# Patient Record
Sex: Female | Born: 1990 | Race: White | Hispanic: No | Marital: Married | Smoking: Never smoker
Health system: Southern US, Community
[De-identification: ages and names within clinical notes are randomized; demographics above are authoritative.]

## PROBLEM LIST (undated history)

## (undated) DIAGNOSIS — G500 Trigeminal neuralgia: Secondary | ICD-10-CM

## (undated) HISTORY — DX: Trigeminal neuralgia: G50.0

---

## 2019-12-25 ENCOUNTER — Encounter: Payer: Self-pay | Admitting: Neurology

## 2019-12-26 NOTE — Progress Notes (Signed)
NEUROLOGY CONSULTATION NOTE  Prabhnoor Ellenberger MRN: [id number] DOB: [birth year]  Referring provider: Maryjane Hurter, MD Primary care provider: No PCP  Reason for consult:  Possible trigeminal neuralgia  HISTORY OF PRESENT ILLNESS: Traci James is a 29 year old right-handed female who presents for possible right-sided trigeminal neuralgia.  She is accompanied by her mother who supplements history.  For the past couple of years, she has had intermittent right facial pain.  Usually it is mild and occurs after a dental cleaning.  Last Thursday, it occurred suddenly without warning and much more severe.  She describes severe paroxysmal shooting pain radiating to the right cheek and along the right side of her jaw.  Sometimes she feels a shooting pain up around her right eye.  Pain is worse at night.  No numbness or tingling but sometimes she notes an itching sensation in her mouth.  The pain occurs off and on all day.  In between, she reports an aching sensation.  She went to Urgent Care on Tuesday and was prescribed Tegretol 200mg  twice daily.  The Tegretol takes the edge off but it does not completely relieve the pain.  On two occasions in the past, she was driving and couldn't make her right foot move for several seconds.    No family history of trigeminal neuralgia but her mother has dermatomyositis.     PAST MEDICAL HISTORY: Past Medical History:  Diagnosis Date  . Trigeminal neuralgia     PAST SURGICAL HISTORY: History reviewed. No pertinent surgical history.  MEDICATIONS: Carbamazepine 200mg  twice daily JUNEL FE  ALLERGIES: No Known Allergies   FAMILY HISTORY: Family History  Problem Relation Age of Onset  . Dermatomyositis Mother     SOCIAL HISTORY: Social History   Socioeconomic History  . Marital status: Married    Spouse name: Not on file  . Number of children: Not on file  . Years of education: Not on file  . Highest education level: Not on file    Occupational History  . Not on file  Tobacco Use  . Smoking status: Never Smoker  . Smokeless tobacco: Never Used  Vaping Use  . Vaping Use: Never used  Substance and Sexual Activity  . Alcohol use: Never  . Drug use: Never  . Sexual activity: Yes    Birth control/protection: Pill  Other Topics Concern  . Not on file  Social History Narrative   Right handed   Social Determinants of Health   Financial Resource Strain:   . Difficulty of Paying Living Expenses: Not on file  Food Insecurity:   . Worried About in the Last Year: Not on file  . Ran Out of Food in the Last Year: Not on file  Transportation Needs:   . Lack of Transportation (Medical): Not on file  . Lack of Transportation (Non-Medical): Not on file  Physical Activity:   . Days of Exercise per Week: Not on file  . Minutes of Exercise per Session: Not on file  Stress:   . Feeling of Stress : Not on file  Social Connections:   . Frequency of Communication with Friends and Family: Not on file  . Frequency of Social Gatherings with Friends and Family: Not on file  . Attends Religious Services: Not on file  . Active Member of Clubs or Organizations: Not on file  . Attends Meetings: Not on file  . Marital Status: Not on file  Intimate Partner Violence:   .  Fear of Current or Ex-Partner: Not on file  . Emotionally Abused: Not on file  . Physically Abused: Not on file  . Sexually Abused: Not on file    PHYSICAL EXAM: Blood pressure 128/87, pulse 93, height 5\' 2"  (1.575 m), weight 196 lb (88.9 kg), SpO2 100 %. General: No acute distress.  Patient appears well-groomed.  Head:  Normocephalic/atraumatic Eyes:  fundi examined but not visualized Neck: supple, no paraspinal tenderness, full range of motion Back: No paraspinal tenderness Heart: regular rate and rhythm Lungs: Clear to auscultation bilaterally. Vascular: No carotid bruits. Neurological Exam: Mental status: alert  and oriented to person, place, and time, recent and remote memory intact, fund of knowledge intact, attention and concentration intact, speech fluent and not dysarthric, language intact. Cranial nerves: CN I: not tested CN II: pupils equal, round and reactive to light, visual fields intact CN III, IV, VI:  full range of motion, no nystagmus, no ptosis CN V: facial sensation intact CN VII: upper and lower face symmetric CN VIII: hearing intact CN IX, X: gag intact, uvula midline CN XI: sternocleidomastoid and trapezius muscles intact CN XII: tongue midline Bulk & Tone: normal, no fasciculations. Motor:  5/5 throughout  Sensation:  Pinprick and vibration sensation intact. Deep Tendon Reflexes:  2+ throughout, toes downgoing.  Finger to nose testing:  Without dysmetria.  Heel to shin:  Without dysmetria.  Gait:  Normal station and stride.  Able to turn and tandem walk. Romberg negative.  IMPRESSION: Right sided trigeminal neuralgia  PLAN: 1.  Continue carbamazepine 200mg  twice daily through the weekend.  If pain not sufficiently controlled by Monday, she is to contact me and we can increase dose to 300mg  twice daily. 2.  Will prescribe tramadol 50mg  for breakthrough pain. 3.  Check MRI of brain and right trigeminal nerve with and without contrast. 4.  Check baseline CBC and CMP for medication management 5.  Follow up 4 months.  Thank you for allowing me to take part in the care of this patient.  , DO

## 2019-12-28 ENCOUNTER — Other Ambulatory Visit (INDEPENDENT_AMBULATORY_CARE_PROVIDER_SITE_OTHER): Payer: Managed Care, Other (non HMO)

## 2019-12-28 ENCOUNTER — Encounter: Payer: Self-pay | Admitting: Neurology

## 2019-12-28 ENCOUNTER — Ambulatory Visit: Payer: Managed Care, Other (non HMO) | Admitting: Neurology

## 2019-12-28 ENCOUNTER — Other Ambulatory Visit: Payer: Self-pay

## 2019-12-28 VITALS — BP 128/87 | HR 93 | Ht 62.0 in | Wt 196.0 lb

## 2019-12-28 DIAGNOSIS — G500 Trigeminal neuralgia: Secondary | ICD-10-CM | POA: Diagnosis not present

## 2019-12-28 DIAGNOSIS — Z79899 Other long term (current) drug therapy: Secondary | ICD-10-CM | POA: Diagnosis not present

## 2019-12-28 LAB — COMPREHENSIVE METABOLIC PANEL
ALT: 17 U/L (ref 0–35)
AST: 17 U/L (ref 0–37)
Albumin: 3.9 g/dL (ref 3.5–5.2)
Alkaline Phosphatase: 63 U/L (ref 39–117)
BUN: 5 mg/dL — ABNORMAL LOW (ref 6–23)
CO2: 26 mEq/L (ref 19–32)
Calcium: 8.7 mg/dL (ref 8.4–10.5)
Chloride: 101 mEq/L (ref 96–112)
Creatinine, Ser: 0.67 mg/dL (ref 0.40–1.20)
GFR: 118.97 mL/min (ref >60.00)
Glucose, Bld: 75 mg/dL (ref 70–99)
Potassium: 4 mEq/L (ref 3.5–5.1)
Sodium: 135 mEq/L (ref 135–145)
Total Bilirubin: 0.2 mg/dL (ref 0.2–1.2)
Total Protein: 7.8 g/dL (ref 6.0–8.3)

## 2019-12-28 LAB — CBC
HCT: 35.6 % — ABNORMAL LOW (ref 36.0–46.0)
Hemoglobin: 11.4 g/dL — ABNORMAL LOW (ref 12.0–15.0)
MCHC: 32 g/dL (ref 30.0–36.0)
MCV: 73.5 fl — ABNORMAL LOW (ref 78.0–100.0)
Platelets: 218 10*3/uL (ref 150.0–400.0)
RBC: 4.85 Mil/uL (ref 3.87–5.11)
RDW: 16 % — ABNORMAL HIGH (ref 11.5–15.5)
WBC: 9.9 10*3/uL (ref 4.0–10.5)

## 2019-12-28 MED ORDER — TRAMADOL HCL 50 MG PO TABS: 50.0000 mg | ORAL_TABLET | Freq: Four times a day (QID) | ORAL | 0 refills | Status: AC | PRN

## 2019-12-28 NOTE — Patient Instructions (Signed)
1.  Continue carbamazepine 200mg  twice daily.  If pain not sufficiently controlled by Monday, contact my office. 2.  Take tramadol as directed/needed for breakthrough pain.  Caution for drowsiness 3.  Check MRI of brain and right trigeminal nerve with and without contrast 4.  Check CBC and CMP 5.  Follow up 4 months.

## 2019-12-31 ENCOUNTER — Telehealth: Payer: Self-pay

## 2019-12-31 NOTE — Telephone Encounter (Signed)
Called patient and informed her of results and that Dr. Everlena Cooper would like her to f/u with her pcp for her anemia. Patient verbalized understanding.

## 2019-12-31 NOTE — Telephone Encounter (Signed)
-----   Message from Drema Dallas, DO sent at 12/31/2019  7:04 AM EST ----- Blood work shows no electrolyte abnormalities.  She does exhibit mild anemia.  She should follow this up with a PCP.

## 2020-01-08 ENCOUNTER — Ambulatory Visit: Payer: Self-pay | Admitting: Neurology

## 2020-01-15 ENCOUNTER — Telehealth: Payer: Self-pay | Admitting: Neurology

## 2020-01-15 ENCOUNTER — Other Ambulatory Visit: Payer: Self-pay | Admitting: Neurology

## 2020-01-15 MED ORDER — CARBAMAZEPINE 200 MG PO TABS: 200.0000 mg | ORAL_TABLET | Freq: Two times a day (BID) | ORAL | 5 refills | Status: AC

## 2020-01-15 NOTE — Telephone Encounter (Signed)
Telephone call to pt, Pt states the 200 BID is working well. Please send a refill

## 2020-01-15 NOTE — Telephone Encounter (Signed)
Refill plus 5 sent to [facility]

## 2020-01-15 NOTE — Telephone Encounter (Signed)
Pt left a VM stating that she needs a refill on the Caramazepine  for the trigeminal neuralgia that her referring provider gave her     She uses the [facility] in [place]

## 2020-01-20 ENCOUNTER — Other Ambulatory Visit: Payer: Self-pay

## 2020-01-20 ENCOUNTER — Ambulatory Visit
Admission: RE | Admit: 2020-01-20 | Discharge: 2020-01-20 | Disposition: A | Discharge status: Home / Self Care | Payer: Managed Care, Other (non HMO) | Source: Ambulatory Visit | Attending: Neurology | Admitting: Neurology

## 2020-01-20 DIAGNOSIS — G500 Trigeminal neuralgia: Secondary | ICD-10-CM

## 2020-01-20 IMAGING — MR MR FACE/TRIGEMINAL WO/W CM
4 of 8 series · 23 of 48 positions shown · IV contrast (multihance)
Comparison: None.

CLINICAL DATA: Right-sided trigeminal neuralgia for 1 month.

EXAM:
MRI FACE TRIGEMINAL WITHOUT AND WITH CONTRAST
TECHNIQUE: Multiplanar, multiecho pulse sequences of the face and surrounding
structures, including thin slice imaging of the course of the
Trigeminal Nerves, were obtained both before and after
administration of intravenous contrast.
CONTRAST:  18mL MULTIHANCE GADOBENATE DIMEGLUMINE 529 MG/ML IV SOLN

[Series 2: T1 · sagittal · 3.0mm · 0.35mm/px · 5 of 40 slices shown (1 of 3)]
[im 1/40]
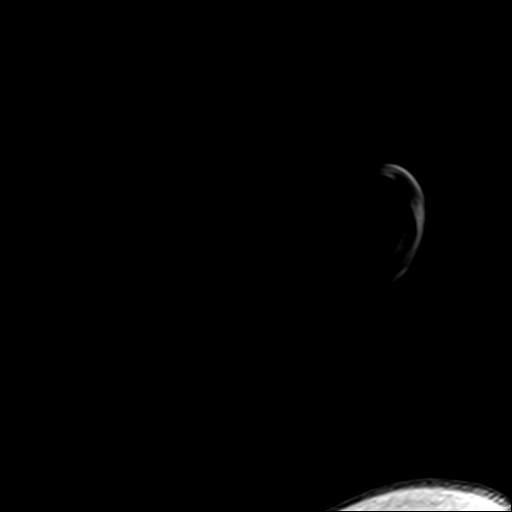
[im 10/40]
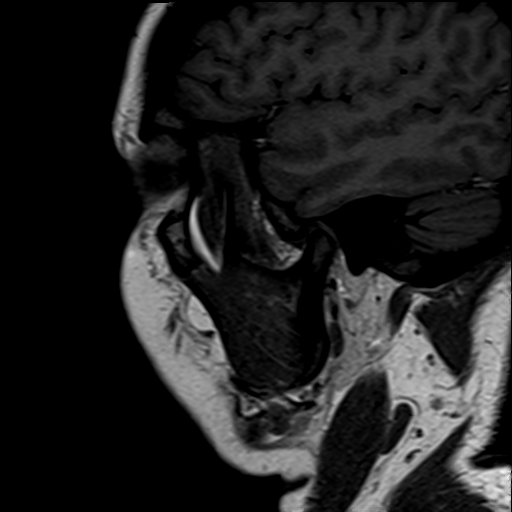
[im 20/40]
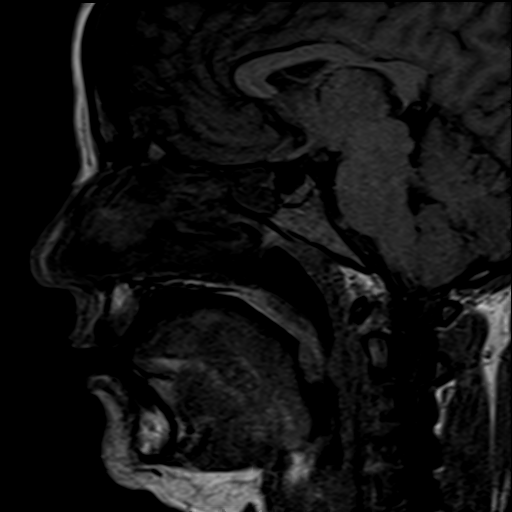
[im 30/40]
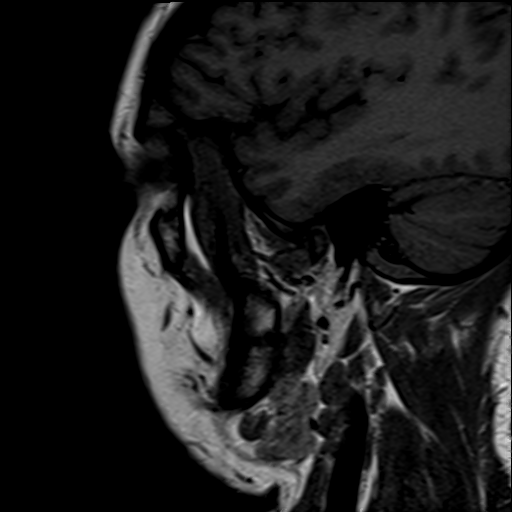
[im 40/40]
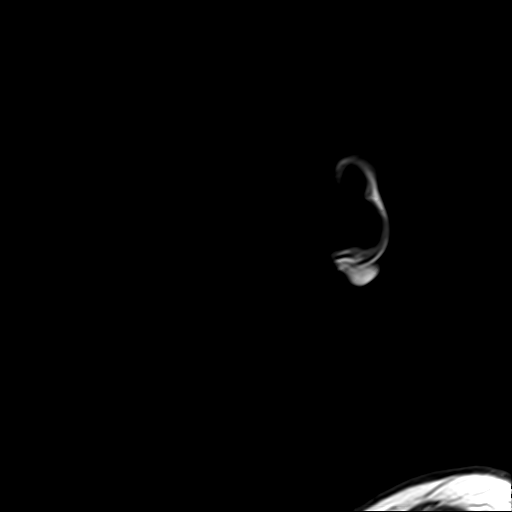

[Series 3: T2 · coronal · 3.0mm · 0.35mm/px · 6 of 41 slices shown]
[im 1/41]
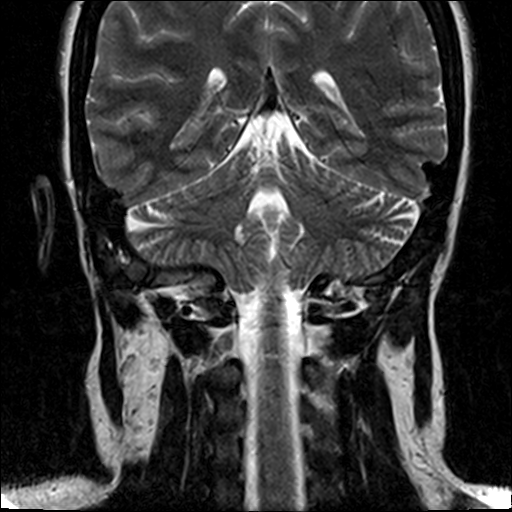
[im 9/41]
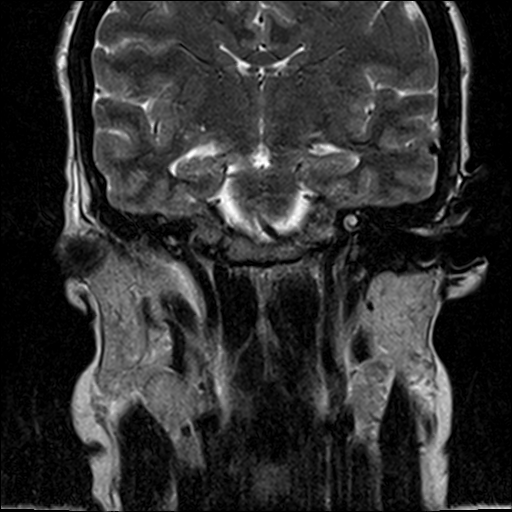
[im 17/41]
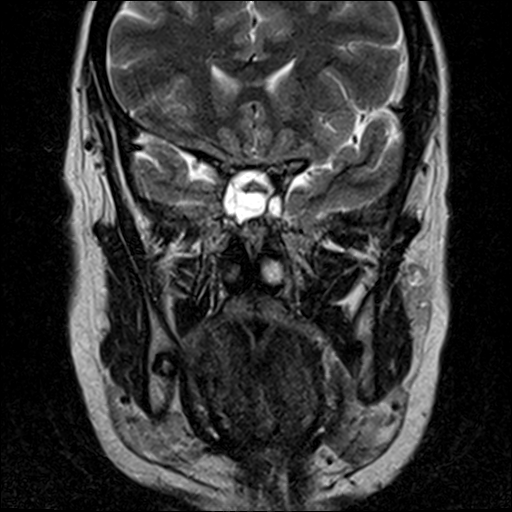
[im 25/41]
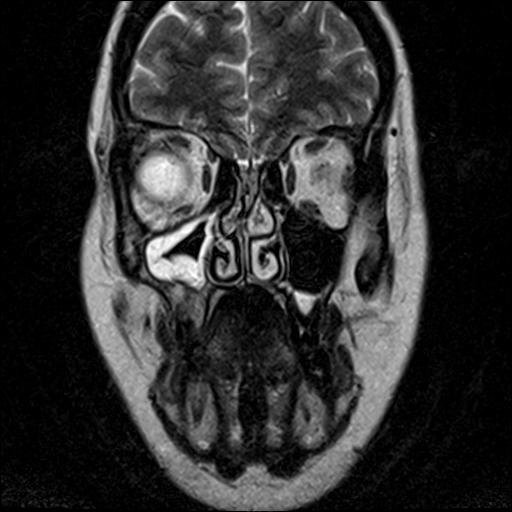
[im 33/41]
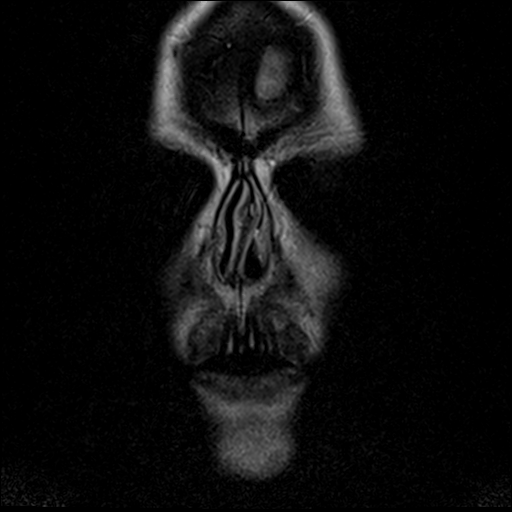
[im 41/41]
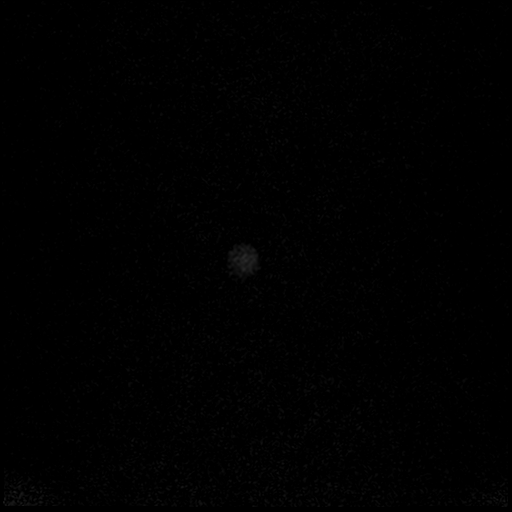

[Series 6: T1 · coronal · 3.0mm · 0.35mm/px · 6 of 40 slices shown (2 of 3)]
[im 1/40]
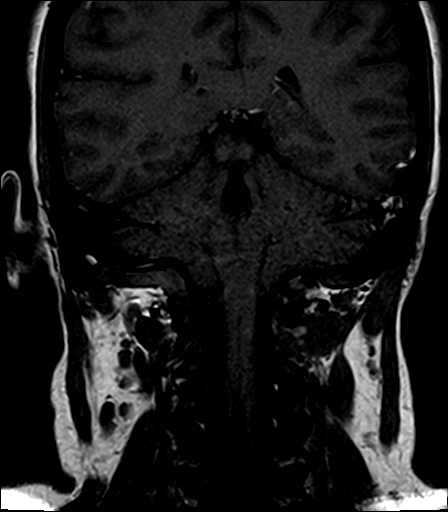
[im 8/40]
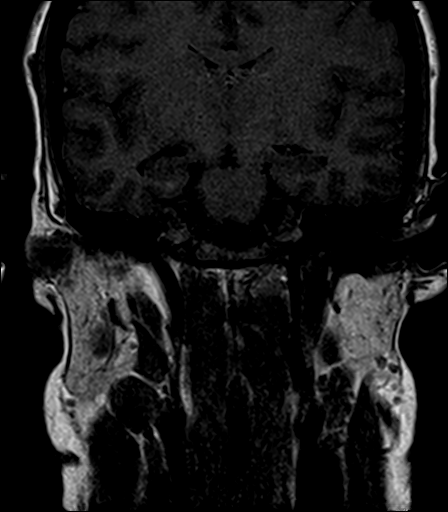
[im 16/40]
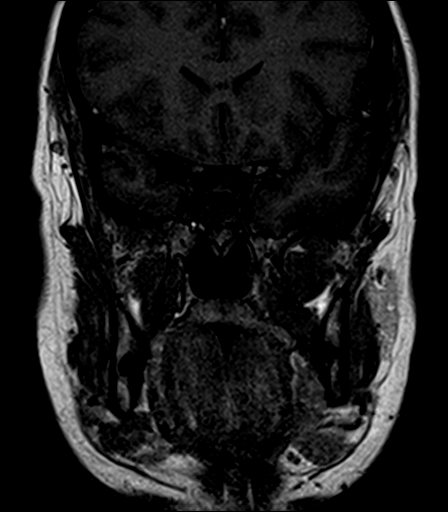
[im 24/40]
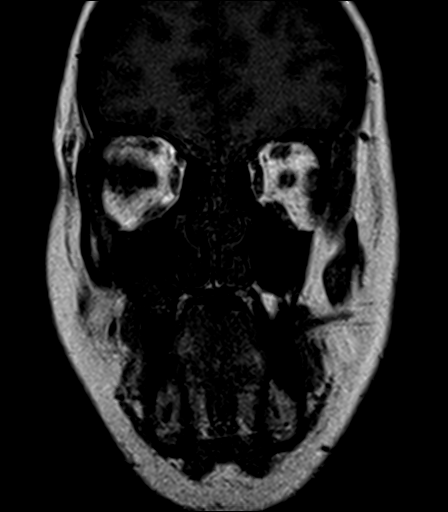
[im 32/40]
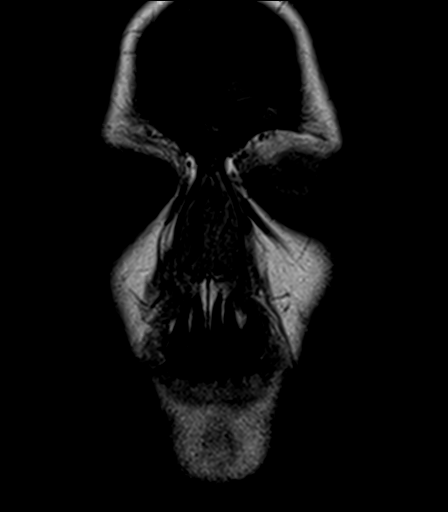
[im 40/40]
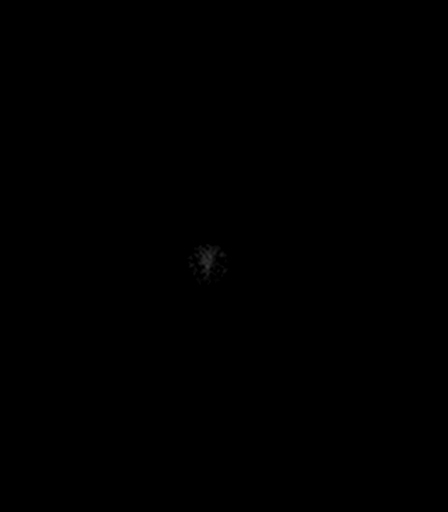

[Series 7: T1 · axial · 3.0mm · 0.50mm/px · z∈[-114,+31]mm · 6 of 45 slices shown (3 of 3)]
[im 1/45]
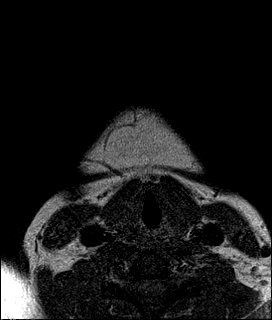
[im 9/45]
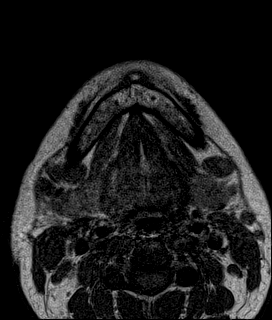
[im 18/45]
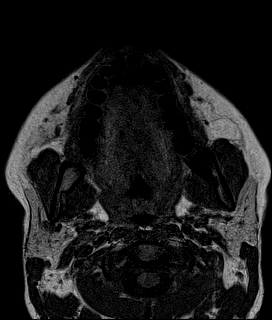
[im 27/45]
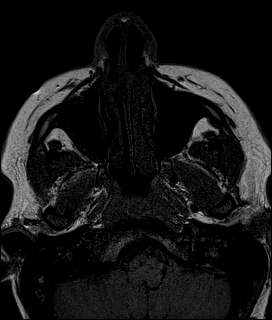
[im 36/45]
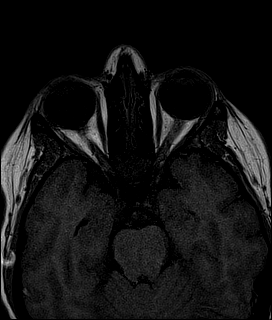
[im 45/45]
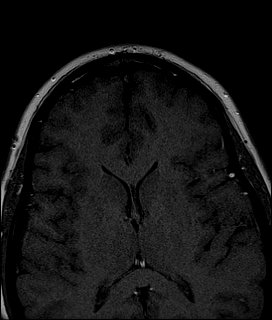

[23 of 48 positions shown; findings below may reference images not displayed]

FINDINGS: There is a normal course of the cisternal segments of the trigeminal
nerves bilaterally. The superior cerebellar arteries course
immediately superior to the trigeminal nerve root entry zones
bilaterally without definite contact or displacement. Meckel's caves
and cavernous sinuses are unremarkable bilaterally.

There is asymmetric marrow STIR hyperintensity/edema and enhancement
along the right maxillary alveolar ridge at the level of the molar
and premolar teeth with mild edema and enhancement also noted within
the overlying soft tissues. There is no fluid collection.

Mildly enlarged level IB lymph nodes measure up to 1.2 cm in short
axis on the right and 1.0 cm on the left, and enlarged level II
lymph nodes measure up to 1.8 cm in short axis bilaterally. There
are subcentimeter intraparotid and periparotid lymph nodes
bilaterally.

There is complete opacification of the sphenoid sinuses by extensive
mucosal thickening, and there is moderate mucosal thickening in the
right maxillary sinus. The orbits are unremarkable. There is trace
right mastoid fluid.
IMPRESSION: 1. Mild edema in the right maxillary alveolar ridge and overlying
soft tissues suggestive of odontogenic infection/inflammation. No
fluid collection or mass.
2. Moderate right maxillary sinus mucosal thickening which could
indicate chronic odontogenic sinusitis.
3. Mildly enlarged bilateral level IB and level II lymph nodes,
nonspecific though may be reactive given the regional inflammation.
4. Otherwise negative trigeminal imaging.

## 2020-01-20 IMAGING — MR MR HEAD WO/W CM
11 series · 48 of 48 positions shown · IV contrast (multihance)
Comparison: None.

CLINICAL DATA: Right-sided trigeminal neuralgia for 1 month.

EXAM:
MRI HEAD WITHOUT AND WITH CONTRAST
TECHNIQUE: Multiplanar, multiecho pulse sequences of the brain and surrounding
structures were obtained without and with intravenous contrast.
CONTRAST:  18mL MULTIHANCE GADOBENATE DIMEGLUMINE 529 MG/ML IV SOLN

[Series 2: T1 · sagittal · 5.0mm · 0.45mm/px · 1 of 21 slices shown]
[im 1/21]
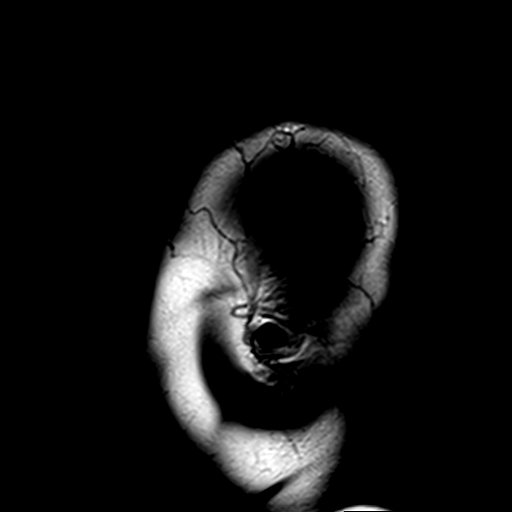

[Series 3: DWI · axial · 3.0mm · 1.80mm/px · z∈[-8,+148]mm · 8 of 106 slices shown (1 of 2)]
[im 1/106]
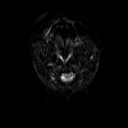
[im 16/106]
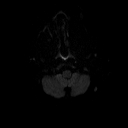
[im 31/106]
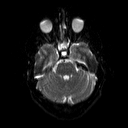
[im 46/106]
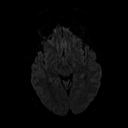
[im 61/106]
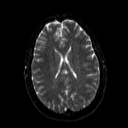
[im 76/106]
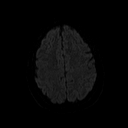
[im 91/106]
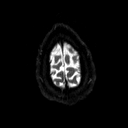
[im 106/106]
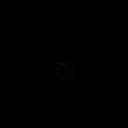

[Series 4: DWI · axial · 3.0mm · 1.80mm/px · z∈[-8,+148]mm · 4 of 53 slices shown (2 of 2)]
[im 1/53]
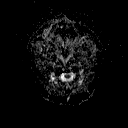
[im 18/53]
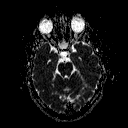
[im 35/53]
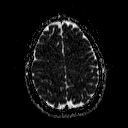
[im 53/53]
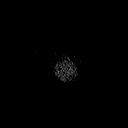

[Series 5: T2 · axial · 5.0mm · 0.60mm/px · z∈[-1,+141]mm · 2 of 22 slices shown (1 of 2)]
[im 1/22]
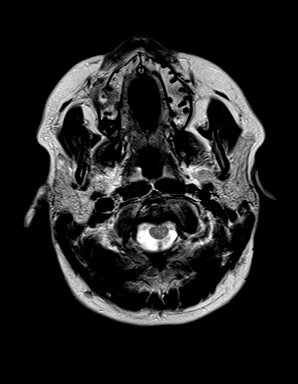
[im 22/22]
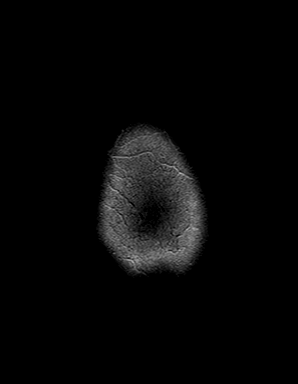

[Series 6: FLAIR · axial · 3.0mm · 0.45mm/px · z∈[-2,+142]mm · 2 of 32 slices shown]
[im 1/32]
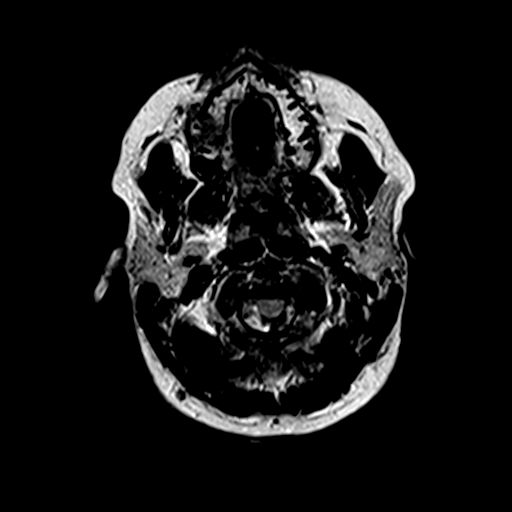
[im 32/32]
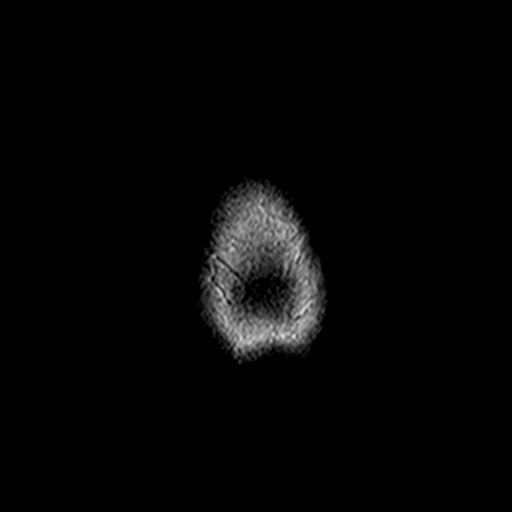

[Series 7: mip_images(sw) · axial · 32.0mm · 0.90mm/px · z∈[+14,+126]mm · 2 of 29 slices shown]
[im 1/29]
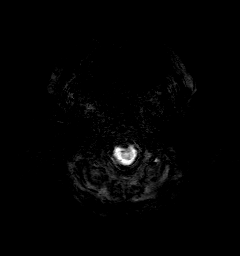
[im 29/29]
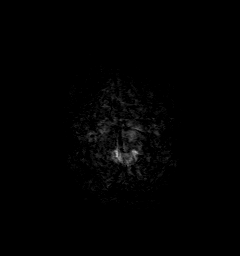

[Series 8: swi_images · axial · 4.0mm · 0.90mm/px · z∈[-0,+140]mm · 3 of 36 slices shown]
[im 1/36]
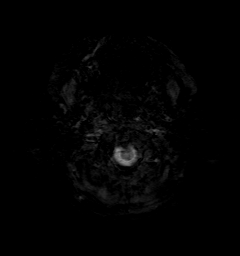
[im 18/36]
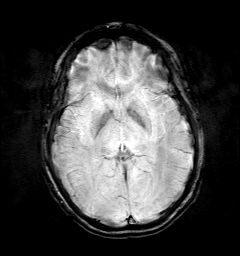
[im 36/36]
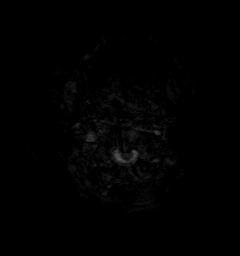

[Series 9: t1_mpr_tra · axial · 1.0mm · 0.75mm/px · z∈[-2,+141]mm · 11 of 144 slices shown (1 of 2)]
[im 1/144]
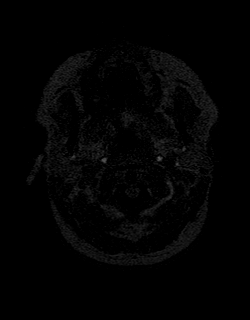
[im 15/144]
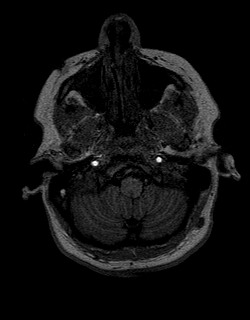
[im 29/144]
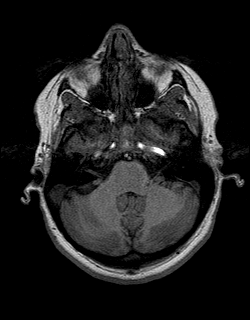
[im 43/144]
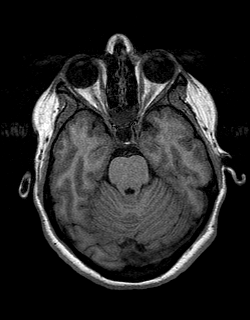
[im 58/144]
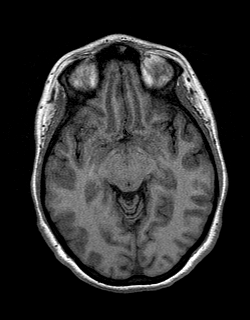
[im 72/144]
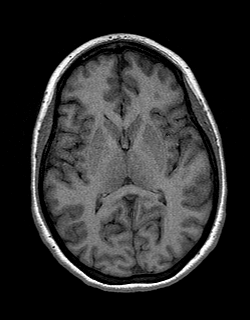
[im 86/144]
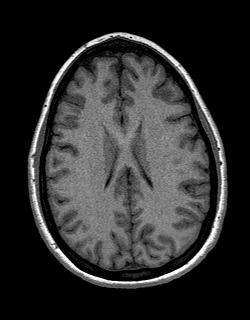
[im 101/144]
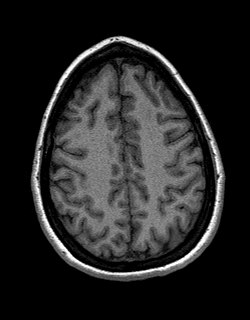
[im 115/144]
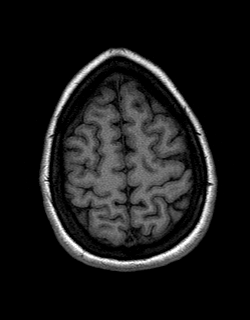
[im 129/144]
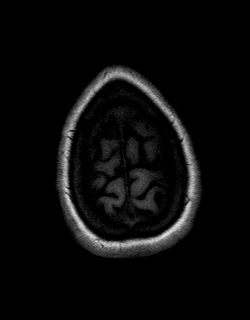
[im 144/144]
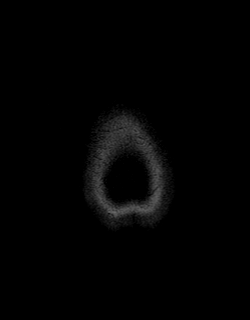

[Series 10: T2 · coronal · 5.0mm · 0.45mm/px · 2 of 30 slices shown (2 of 2)]
[im 1/30]
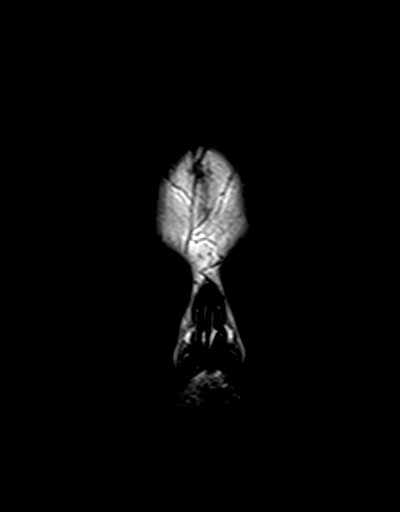
[im 30/30]
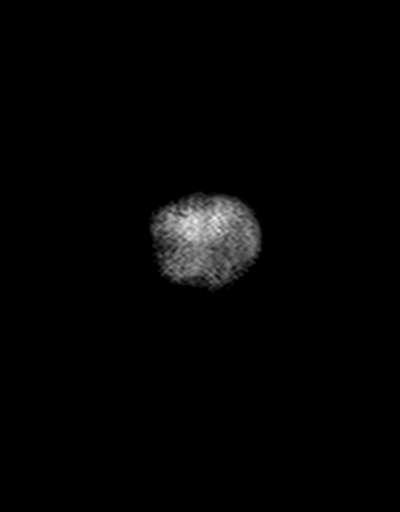

[Series 11: t1_mpr_tra · axial · 1.0mm · 0.75mm/px · z∈[-2,+141]mm · 11 of 144 slices shown (2 of 2)]
[im 1/144]
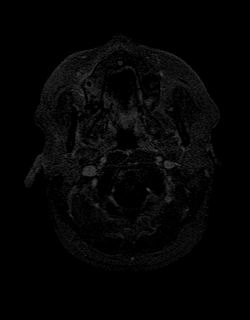
[im 15/144]
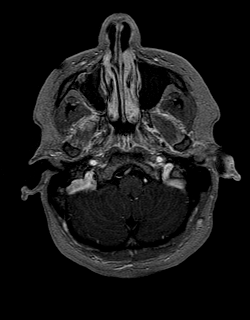
[im 29/144]
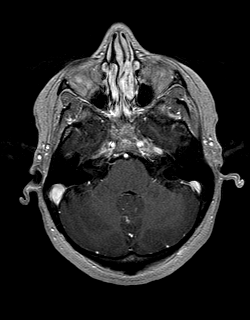
[im 43/144]
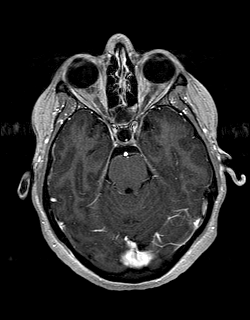
[im 58/144]
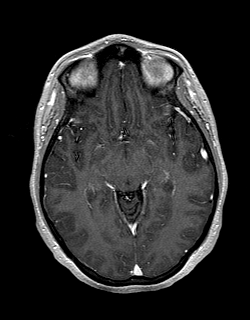
[im 72/144]
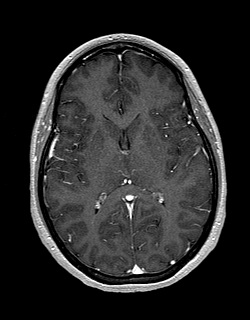
[im 86/144]
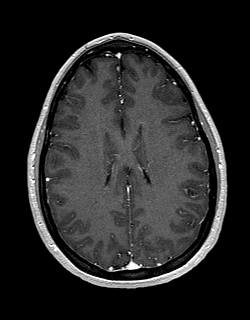
[im 101/144]
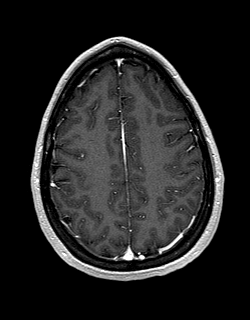
[im 115/144]
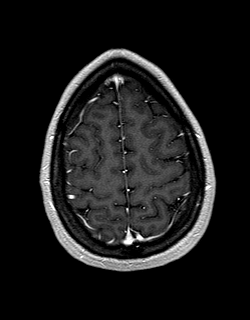
[im 129/144]
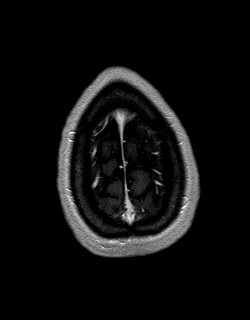
[im 144/144]
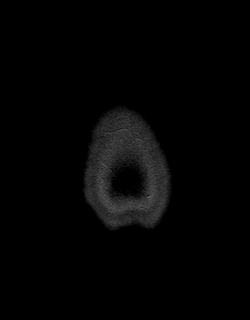

[Series 12: post cor · coronal · 5.0mm · 0.45mm/px · 2 of 30 slices shown]
[im 1/30]
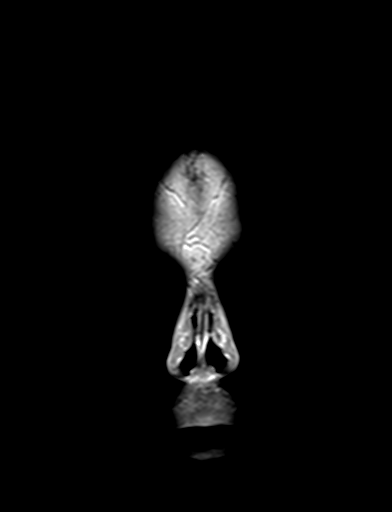
[im 30/30]
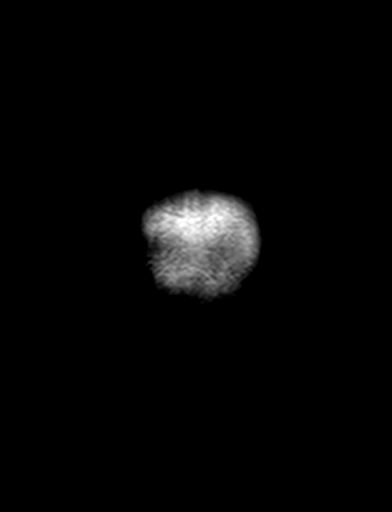

[48 of 48 positions shown; findings below may reference images not displayed]

FINDINGS: Brain: There is no evidence of an acute infarct, intracranial
hemorrhage, mass, midline shift, or extra-axial fluid collection.
The ventricles and sulci are normal. The brain is normal in signal.
No abnormal enhancement is identified.

Vascular: Major intracranial vascular flow voids are preserved.

Skull and upper cervical spine: Unremarkable bone marrow signal.

Sinuses/Orbits: Unremarkable orbits. Complete opacification of the
sphenoid sinuses by mucosal thickening. Moderate mucosal thickening
in the right maxillary sinus. Trace right mastoid fluid.

Other: None.
IMPRESSION: Unremarkable appearance of the brain.

## 2020-01-20 MED ORDER — GADOBENATE DIMEGLUMINE 529 MG/ML IV SOLN
18.0000 mL | Freq: Once | INTRAVENOUS | Status: AC | PRN
  Administered 2020-01-20: 18 mL via INTRAVENOUS

## 2020-05-20 NOTE — Progress Notes (Deleted)
NEUROLOGY FOLLOW UP OFFICE NOTE  Traci James [id number]  Assessment/Plan:   ***  Subjective:  Traci James is a 30 year old right-handed female who follows up for right facial pain .  She is accompanied by her mother who supplements history.  MRI of brain and trigeminal nerve personally reviewed.  UPDATE: Current medication:  Carbamazepine 200mg  BID  She had an MRI of brain and right trigeminal nerve with and without contrast on 01/20/2020.  Brain looked normal but there was evidence of possible right odontogenic infection/inflammation and chronic odontogenic right maxillary sinusitis.  Advised to follow up with PCP for further treatment.  ***  ***  12/28/2019 LABS:  CMP with Na 135, K 4, Cl 101, CO2 26, glucose 75, BUN 5, Cr 0.67, t bili 0.2, ALP 63, AST 17, ALT 17; CBC with WBC 9.9, HGB 11.4, HCT 34.6, MCV 73.5, PLT 218  HISTORY: For the past couple of years, she has had intermittent right facial pain.  Usually it is mild and occurs after a dental cleaning.  Last Thursday, it occurred suddenly without warning and much more severe.  She describes severe paroxysmal shooting pain radiating to the right cheek and along the right side of her jaw.  Sometimes she feels a shooting pain up around her right eye.  Pain is worse at night.  No numbness or tingling but sometimes she notes an itching sensation in her mouth.  The pain occurs off and on all day.  In between, she reports an aching sensation.  She went to Urgent Care on Tuesday and was prescribed Tegretol 200mg  twice daily.  The Tegretol takes the edge off but it does not completely relieve the pain.  On two occasions in the past, she was driving and couldn't make her right foot move for several seconds.    No family history of trigeminal neuralgia but her mother has dermatomyositis.    PAST MEDICAL HISTORY: Past Medical History:  Diagnosis Date  . Trigeminal neuralgia     MEDICATIONS: Current Outpatient Medications on  File Prior to Visit  Medication Sig Dispense Refill  . carbamazepine (TEGRETOL) 200 MG tablet Take 1 tablet (200 mg total) by mouth 2 (two) times daily. 60 tablet 5  . JUNEL FE 1/20 1-20 MG-MCG tablet Take 1 tablet by mouth daily.    . traMADol (ULTRAM) 50 MG tablet Take 1 tablet (50 mg total) by mouth every 6 (six) hours as needed. 28 tablet 0   No current facility-administered medications on file prior to visit.    ALLERGIES: No Known Allergies  FAMILY HISTORY: Family History  Problem Relation Age of Onset  . Dermatomyositis Mother       Objective:  *** General: No acute distress.  Patient appears ***-groomed.   Head:  Normocephalic/atraumatic Eyes:  Fundi examined but not visualized Neck: supple, no paraspinal tenderness, full range of motion Heart:  Regular rate and rhythm Lungs:  Clear to auscultation bilaterally Back: No paraspinal tenderness Neurological Exam: alert and oriented to person, place, and time. Attention span and concentration intact, recent and remote memory intact, fund of knowledge intact.  Speech fluent and not dysarthric, language intact.  CN II-XII intact. Bulk and tone normal, muscle strength 5/5 throughout.  Sensation to light touch, temperature and vibration intact.  Deep tendon reflexes 2+ throughout, toes downgoing.  Finger to nose and heel to shin testing intact.  Gait normal, Romberg negative.     , DO

## 2020-05-21 ENCOUNTER — Encounter: Payer: Self-pay | Admitting: Neurology

## 2020-05-21 ENCOUNTER — Ambulatory Visit: Payer: Managed Care, Other (non HMO) | Admitting: Neurology

## 2020-05-21 DIAGNOSIS — Z029 Encounter for administrative examinations, unspecified: Secondary | ICD-10-CM
